# Patient Record
Sex: Female | Born: 1970 | Race: Black or African American | Hispanic: No | Marital: Married | Smoking: Never smoker
Health system: Southern US, Community
[De-identification: ages and names within clinical notes are randomized; demographics above are authoritative.]

## PROBLEM LIST (undated history)

## (undated) DIAGNOSIS — R51 Headache: Secondary | ICD-10-CM

## (undated) DIAGNOSIS — D219 Benign neoplasm of connective and other soft tissue, unspecified: Secondary | ICD-10-CM

## (undated) DIAGNOSIS — D649 Anemia, unspecified: Secondary | ICD-10-CM

## (undated) HISTORY — PX: WISDOM TOOTH EXTRACTION: SHX21

## (undated) HISTORY — PX: NO PAST SURGERIES: SHX2092

---

## 1998-01-10 ENCOUNTER — Encounter (HOSPITAL_COMMUNITY): Admission: RE | Admit: 1998-01-10 | Discharge: 1998-04-10 | Payer: Self-pay | Admitting: Obstetrics and Gynecology

## 1998-06-21 ENCOUNTER — Encounter (HOSPITAL_COMMUNITY): Admission: RE | Admit: 1998-06-21 | Discharge: 1998-09-19 | Payer: Self-pay | Admitting: Obstetrics and Gynecology

## 1998-09-29 ENCOUNTER — Encounter (HOSPITAL_COMMUNITY): Admission: RE | Admit: 1998-09-29 | Discharge: 1998-12-28 | Payer: Self-pay | Admitting: *Deleted

## 1999-01-22 ENCOUNTER — Encounter (HOSPITAL_COMMUNITY): Admission: RE | Admit: 1999-01-22 | Discharge: 1999-04-22 | Payer: Self-pay | Admitting: *Deleted

## 1999-04-24 ENCOUNTER — Encounter (HOSPITAL_COMMUNITY): Admission: RE | Admit: 1999-04-24 | Discharge: 1999-07-23 | Payer: Self-pay | Admitting: *Deleted

## 2001-12-17 ENCOUNTER — Other Ambulatory Visit: Admission: RE | Admit: 2001-12-17 | Discharge: 2001-12-17 | Payer: Self-pay | Admitting: Obstetrics and Gynecology

## 2002-07-17 ENCOUNTER — Inpatient Hospital Stay (HOSPITAL_COMMUNITY): Admission: AD | Admit: 2002-07-17 | Discharge: 2002-07-20 | Payer: Self-pay | Admitting: Obstetrics and Gynecology

## 2005-07-03 ENCOUNTER — Ambulatory Visit (HOSPITAL_COMMUNITY): Admission: RE | Admit: 2005-07-03 | Discharge: 2005-07-03 | Payer: Self-pay | Admitting: Sports Medicine

## 2010-08-23 ENCOUNTER — Emergency Department (HOSPITAL_COMMUNITY): Admission: EM | Admit: 2010-08-23 | Discharge: 2010-08-23 | Payer: Self-pay | Admitting: Emergency Medicine
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
## 2011-09-04 ENCOUNTER — Other Ambulatory Visit: Payer: Self-pay | Admitting: Obstetrics and Gynecology

## 2011-09-04 DIAGNOSIS — Z1231 Encounter for screening mammogram for malignant neoplasm of breast: Secondary | ICD-10-CM

## 2011-09-20 ENCOUNTER — Ambulatory Visit
Admission: RE | Admit: 2011-09-20 | Discharge: 2011-09-20 | Disposition: A | Discharge status: Home / Self Care | Payer: Self-pay | Source: Ambulatory Visit | Attending: Obstetrics and Gynecology | Admitting: Obstetrics and Gynecology

## 2011-09-20 DIAGNOSIS — Z1231 Encounter for screening mammogram for malignant neoplasm of breast: Secondary | ICD-10-CM

## 2012-04-15 ENCOUNTER — Encounter: Payer: Self-pay | Admitting: Obstetrics and Gynecology

## 2012-04-16 ENCOUNTER — Encounter: Payer: Self-pay | Admitting: Obstetrics and Gynecology

## 2012-07-09 ENCOUNTER — Ambulatory Visit (INDEPENDENT_AMBULATORY_CARE_PROVIDER_SITE_OTHER): Payer: Self-pay | Admitting: Obstetrics and Gynecology

## 2012-07-09 VITALS — BP 100/60 | Wt 141.0 lb

## 2012-07-09 DIAGNOSIS — N926 Irregular menstruation, unspecified: Secondary | ICD-10-CM

## 2012-07-09 DIAGNOSIS — N920 Excessive and frequent menstruation with regular cycle: Secondary | ICD-10-CM

## 2012-07-10 LAB — CBC
Hemoglobin: 10.9 g/dL — ABNORMAL LOW (ref 12.0–15.0)
MCH: 28.1 pg (ref 26.0–34.0)
MCHC: 33.4 g/dL (ref 30.0–36.0)
RBC: 3.88 MIL/uL (ref 3.87–5.11)

## 2012-07-10 LAB — TSH: TSH: 1.174 u[IU]/mL (ref 0.350–4.500)

## 2012-07-14 NOTE — Progress Notes (Signed)
C/o heavy cycles w/ clotting Pt already taking OCPs   U/s SHG/EBx at [facility]. Will discuss tx at that time.

## 2012-08-19 ENCOUNTER — Telehealth: Payer: Self-pay | Admitting: Obstetrics and Gynecology

## 2012-08-19 NOTE — Telephone Encounter (Signed)
nd pt 

## 2012-08-20 NOTE — Telephone Encounter (Signed)
Lm on vm tcb rgd msg 

## 2012-08-20 NOTE — Telephone Encounter (Signed)
Spoke with pt rgd msg pt wants test results informed tsh cbc wnl pt also wants to schd sonohystergram pt transferred to [facility] to schd

## 2012-09-10 ENCOUNTER — Encounter: Payer: Self-pay | Admitting: Obstetrics and Gynecology

## 2012-09-11 ENCOUNTER — Ambulatory Visit (INDEPENDENT_AMBULATORY_CARE_PROVIDER_SITE_OTHER): Payer: Self-pay | Admitting: Obstetrics and Gynecology

## 2012-09-11 ENCOUNTER — Encounter: Payer: Self-pay | Admitting: Obstetrics and Gynecology

## 2012-09-11 VITALS — BP 120/70 | Wt 140.0 lb

## 2012-09-11 DIAGNOSIS — N920 Excessive and frequent menstruation with regular cycle: Secondary | ICD-10-CM

## 2012-09-11 NOTE — Progress Notes (Signed)
Pt currently taking microgestin, wants to discuss other birth control options.  Pt still with  heavy bleeding.  She is slightly anemic.  TSH WNL All BC reviewed with the pt She desires Mirena.  Will place if [facility] is normal Pt given lo/lo estrin until IUD placed.  Pt given a sample

## 2012-09-25 ENCOUNTER — Other Ambulatory Visit: Payer: Self-pay

## 2012-09-25 DIAGNOSIS — N926 Irregular menstruation, unspecified: Secondary | ICD-10-CM

## 2012-09-25 DIAGNOSIS — N920 Excessive and frequent menstruation with regular cycle: Secondary | ICD-10-CM

## 2012-09-26 ENCOUNTER — Other Ambulatory Visit: Payer: Self-pay | Admitting: Obstetrics and Gynecology

## 2012-09-26 ENCOUNTER — Ambulatory Visit (INDEPENDENT_AMBULATORY_CARE_PROVIDER_SITE_OTHER): Payer: Self-pay

## 2012-09-26 ENCOUNTER — Encounter: Payer: Self-pay | Admitting: Obstetrics and Gynecology

## 2012-09-26 ENCOUNTER — Ambulatory Visit: Payer: Self-pay | Admitting: Obstetrics and Gynecology

## 2012-09-26 VITALS — BP 140/90 | Wt 141.0 lb

## 2012-09-26 DIAGNOSIS — D219 Benign neoplasm of connective and other soft tissue, unspecified: Secondary | ICD-10-CM

## 2012-09-26 DIAGNOSIS — N926 Irregular menstruation, unspecified: Secondary | ICD-10-CM

## 2012-09-26 MED ORDER — NORETHINDRONE ACET-ETHINYL EST 1-20 MG-MCG PO TABS: 1.0000 | ORAL_TABLET | Freq: Every day | ORAL | Status: DC

## 2012-09-26 NOTE — Patient Instructions (Signed)
Uterine Fibroid  A uterine fibroid is a growth (tumor) that occurs in a woman's uterus. This type of tumor is not cancerous and does not spread out of the uterus. A woman can have one or many fibroids, and the fiboid(s) can become quite large. A fibroid can vary in size, weight, and where it grows in the uterus. Most fibroids do not require medical treatment, but some can cause pain or heavy bleeding during and between periods.  CAUSES   A fibroid is the result of a single uterine cell that keeps growing (unregulated), which is different than most cells in the human body. Most cells have a control mechanism that keeps them from reproducing without control.   SYMPTOMS    Bleeding.   Pelvic pain and pressure.   Bladder problems due to the size of the fibroid.   Infertility and miscarriages depending on the size and location of the fibroid.  DIAGNOSIS   A diagnosis is made by physical exam. Your caregiver may feel the lumpy tumors during a pelvic exam. Important information regarding size, location, and number of tumors can be gained by having an ultrasound. It is rare that other tests, such as a CT scan or MRI, are needed.  TREATMENT    Your caregiver may recommend watchful waiting. This involves getting the fibroid checked by your caregiver to see if the fibroids grow or shrink.    Hormonal treatment or an intrauterine device (IUD) may be prescribed.    Surgery may be needed to remove the fibroids (myomectomy) or the uterus (hysterectomy). This depends on your situation.  When fibroids interfere with fertility and a woman wants to become pregnant, a caregiver may recommend having the fibroids removed.   HOME CARE INSTRUCTIONS   Home care depends on how you were treated. In general:    Keep all follow-up appointments with your caregiver.    Only take medicine as told by your caregiver. Do not take aspirin. It can cause bleeding.    If you have excessive periods and soak tampons or pads in a half hour or  less, contact your caregiver immediately. If your periods are troublesome but not so heavy, lie down with your feet raised slightly above your heart. Place cold packs on your lower abdomen.    If your periods are heavy, write down the number of pads or tampons you use per month. Bring this information to your caregiver.    Talk to your caregiver about taking iron pills.    Include green vegetables in your diet.    If you were prescribed a hormonal treatment, take the hormonal medicines as directed.    If you need surgery, ask your caregiver for information on your specific surgery.   SEEK IMMEDIATE MEDICAL CARE IF:   You have pelvic pain or cramps not controlled with medicines.    You have a sudden increase in pelvic pain.    You have an increase of bleeding between and during periods.    You feel lightheaded or have fainting episodes.   MAKE SURE YOU:   Understand these instructions.   Will watch your condition.   Will get help right away if you are not doing well or get worse.  Document Released: 11/23/2000 Document Revised: 02/18/2012 Document Reviewed: 12/17/2011  Exit[employer] Patient Information 2013 [employer].

## 2012-09-26 NOTE — Progress Notes (Signed)
SHG/EMBX:  The patient was consented for both procedures.  She was placed in dorsal lithotomy position and speculum placed in the vagina.  The cervix was cleansed with three betadine swabs.  The endometrial pipet was placed in the the endometrial cavity through the cervix.  The uterus did sound to 8cm.  The pipet was removed and specimen was sent to pathology.  The sonohysterography catheter was then placed through the cervix and vaginal probe placed back in the vagina. Pt had four fibroids one with a submucosal componentt.  The uterus measures 10.23cm by 8.54 cm.  lnormal ovaries B All treatments given to pt for fibroids she will call with decision

## 2012-10-01 ENCOUNTER — Telehealth: Payer: Self-pay

## 2012-10-01 NOTE — Telephone Encounter (Signed)
Spoke with pt rgd labs informed endo biopsy wnl pt voice understanding 

## 2012-10-01 NOTE — Telephone Encounter (Signed)
Message copied by Rolla Plate on Wed Oct 01, 2012  8:49 AM ------      Message from: Jaymes Graff      Created: Tue Sep 30, 2012  9:56 PM       Please call the patient and let her know her endometrial biopsy is normal

## 2012-10-29 ENCOUNTER — Other Ambulatory Visit: Payer: Self-pay | Admitting: Obstetrics and Gynecology

## 2012-10-29 DIAGNOSIS — Z1231 Encounter for screening mammogram for malignant neoplasm of breast: Secondary | ICD-10-CM

## 2012-11-10 ENCOUNTER — Encounter: Payer: Self-pay | Admitting: Obstetrics and Gynecology

## 2012-11-10 ENCOUNTER — Ambulatory Visit (INDEPENDENT_AMBULATORY_CARE_PROVIDER_SITE_OTHER): Payer: Self-pay | Admitting: Obstetrics and Gynecology

## 2012-11-10 VITALS — BP 124/80 | Wt 145.0 lb

## 2012-11-10 DIAGNOSIS — N920 Excessive and frequent menstruation with regular cycle: Secondary | ICD-10-CM

## 2012-11-10 MED ORDER — CYCLOBENZAPRINE HCL 5 MG PO TABS: 5.0000 mg | ORAL_TABLET | Freq: Three times a day (TID) | ORAL | Status: DC | PRN

## 2012-11-10 NOTE — Patient Instructions (Signed)

## 2012-11-10 NOTE — Progress Notes (Signed)
F/u fibroids  All txs for fibroids and menorrhagia reviewed with the pt and her husband.  ( obs, hormones, lupron, [place], endometrial ablation, myomectomy and hysterectomy.   The pt desires to have an endometrial ablation.  Risks and benefits reviewed with the pt.  20 min spent with the pt.will scheudle the procedure.  She also desires flexeril for back spasms during her menses

## 2012-11-17 ENCOUNTER — Telehealth: Payer: Self-pay | Admitting: Obstetrics and Gynecology

## 2012-12-04 ENCOUNTER — Ambulatory Visit: Payer: Self-pay

## 2013-01-05 ENCOUNTER — Ambulatory Visit
Admission: RE | Admit: 2013-01-05 | Discharge: 2013-01-05 | Disposition: A | Discharge status: Home / Self Care | Payer: BC Managed Care – PPO | Source: Ambulatory Visit | Attending: Obstetrics and Gynecology | Admitting: Obstetrics and Gynecology

## 2013-01-05 DIAGNOSIS — Z1231 Encounter for screening mammogram for malignant neoplasm of breast: Secondary | ICD-10-CM

## 2013-02-06 ENCOUNTER — Other Ambulatory Visit: Payer: Self-pay

## 2013-02-06 MED ORDER — DIAZEPAM 10 MG PO TABS: ORAL_TABLET | ORAL | Status: DC

## 2013-02-06 MED ORDER — IBUPROFEN 800 MG PO TABS: 800.0000 mg | ORAL_TABLET | Freq: Three times a day (TID) | ORAL | Status: DC | PRN

## 2013-02-06 MED ORDER — PROMETHAZINE HCL 25 MG PO TABS: ORAL_TABLET | ORAL | Status: DC

## 2013-02-06 MED ORDER — HYDROCODONE-ACETAMINOPHEN 5-300 MG PO TABS: ORAL_TABLET | ORAL | Status: DC

## 2013-02-16 ENCOUNTER — Telehealth: Payer: Self-pay | Admitting: Obstetrics and Gynecology

## 2013-02-17 ENCOUNTER — Ambulatory Visit: Payer: BC Managed Care – PPO | Admitting: Obstetrics and Gynecology

## 2013-02-17 ENCOUNTER — Encounter: Payer: Self-pay | Admitting: Obstetrics and Gynecology

## 2013-02-17 VITALS — BP 116/70 | Ht 64.0 in | Wt 141.0 lb

## 2013-02-17 DIAGNOSIS — N920 Excessive and frequent menstruation with regular cycle: Secondary | ICD-10-CM

## 2013-02-17 MED ORDER — NORETHINDRONE-ETH ESTRADIOL 1-50 MG-MCG PO TABS: 1.0000 | ORAL_TABLET | Freq: Every day | ORAL | Status: DC

## 2013-02-17 MED ORDER — FERROUS SULFATE 325 (65 FE) MG PO TBEC: 325.0000 mg | DELAYED_RELEASE_TABLET | Freq: Two times a day (BID) | ORAL | Status: DC

## 2013-02-17 MED ORDER — IBUPROFEN 600 MG PO TABS: 600.0000 mg | ORAL_TABLET | Freq: Four times a day (QID) | ORAL | Status: DC | PRN

## 2013-02-17 NOTE — Patient Instructions (Signed)

## 2013-02-17 NOTE — Progress Notes (Signed)
When did bleeding start: 02/01/13 How  Long: 2 weeks How often changing pad/tampon: the first week heavy every hr second week every 2 hrs pasing clots Bleeding Disorders: no Cramping: no Contraception: yes Fibroids: yes Hormone Therapy: no New Medications: no Menopausal Symptoms: no Vag. Discharge: no Abdominal Pain: yes Increased Stress: no  Pt also c/o headaches for 2 weeks same time bleeding started.  Pt with SOB with movement.  Motrin helps a little with the headaches.  Pt missed one month of OCPs bc it was on back oreder.  She just started the pills again 2/23/ 14 and is still bleeding.  She has not missed any pillls.  She denies chest pain or visual changes.   BP 116/70  Ht 5\' 4"  (1.626 m)  Wt 141 lb (63.957 kg)  BMI 24.19 kg/m2  LMP 02/01/2013 Physical Examination: General appearance - alert, well appearing, and in no distress Mental status - alert, oriented to person, place, and time Chest - clear to auscultation, no wheezes, rales or rhonchi, symmetric air entry Heart - normal rate and regular rhythm Abdomen - soft, nontender, nondistended, no masses or organomegaly Pelvic - VULVA: normal appearing vulva with no masses, tenderness or lesions, VAGINA: normal appearing vagina with normal color and discharge, no lesions, CERVIX: normal appearing cervix without discharge or lesions, moderate VB, UTERUS: enlarged to 12 week's size, irregular, ADNEXA: normal adnexa in size, nontender and no masses Extremities - peripheral pulses normal, no pedal edema, no clubbing or cyanosis Results for orders placed in visit on 02/17/13  POCT URINE PREGNANCY      Result Value Range   Preg Test, Ur Negative     Menorrhagia pt scheduled for  Ablation in April.  Offered it earlier.  Pt offered in pt tx vs out pt tx.  She will do pill taper with OCp and iron. With tylenol and motrin for headaches.  Check a CBC today.  Risk of DVT explained in detail to the pt

## 2013-02-18 ENCOUNTER — Encounter: Payer: Self-pay | Admitting: Obstetrics and Gynecology

## 2013-02-18 ENCOUNTER — Telehealth: Payer: Self-pay | Admitting: Obstetrics and Gynecology

## 2013-02-18 LAB — CBC WITH DIFFERENTIAL/PLATELET
Basophils Absolute: 0 10*3/uL (ref 0.0–0.1)
Basophils Relative: 1 % (ref 0–1)
Eosinophils Absolute: 0.1 10*3/uL (ref 0.0–0.7)
HCT: 16.1 % — ABNORMAL LOW (ref 36.0–46.0)
MCV: 78.5 fL (ref 78.0–100.0)
Monocytes Absolute: 0.4 10*3/uL (ref 0.1–1.0)
Neutro Abs: 4.4 10*3/uL (ref 1.7–7.7)
Neutrophils Relative %: 73 % (ref 43–77)
Platelets: 296 10*3/uL (ref 150–400)

## 2013-02-18 NOTE — Progress Notes (Unsigned)
Patient ID: Brand Males, female   DOB: Mar 10, 1971, 42 y.o.   MRN: [id number] Phone call to patient .  She was told she has a hemoglobin of 5.1.  She wants to come in for blood transfusion and IV premarin.  She still has some mild bleeding and does not want to be admitted.  She is planning to have an ablation

## 2013-02-18 NOTE — Telephone Encounter (Signed)
TC to pt. States has already spoken with someone and issue has been resolved.

## 2013-02-18 NOTE — Telephone Encounter (Signed)
VM from pt. Saw Dr ND 02/17/13. RX not covered by insurance. Is there a generic?  Pt [phone]

## 2013-02-19 ENCOUNTER — Encounter (HOSPITAL_COMMUNITY): Payer: Self-pay | Admitting: General Practice

## 2013-02-19 ENCOUNTER — Telehealth: Payer: Self-pay

## 2013-02-19 ENCOUNTER — Observation Stay (HOSPITAL_COMMUNITY)
Admission: AD | Admit: 2013-02-19 | Discharge: 2013-02-19 | Disposition: A | Discharge status: Home / Self Care | Payer: BC Managed Care – PPO | Source: Ambulatory Visit | Attending: Obstetrics and Gynecology | Admitting: Obstetrics and Gynecology

## 2013-02-19 DIAGNOSIS — D62 Acute posthemorrhagic anemia: Secondary | ICD-10-CM | POA: Diagnosis present

## 2013-02-19 DIAGNOSIS — D259 Leiomyoma of uterus, unspecified: Secondary | ICD-10-CM | POA: Insufficient documentation

## 2013-02-19 DIAGNOSIS — N920 Excessive and frequent menstruation with regular cycle: Secondary | ICD-10-CM | POA: Insufficient documentation

## 2013-02-19 DIAGNOSIS — D649 Anemia, unspecified: Principal | ICD-10-CM | POA: Insufficient documentation

## 2013-02-19 HISTORY — DX: Headache: R51

## 2013-02-19 HISTORY — DX: Benign neoplasm of connective and other soft tissue, unspecified: D21.9

## 2013-02-19 HISTORY — DX: Anemia, unspecified: D64.9

## 2013-02-19 MED ORDER — DIPHENHYDRAMINE HCL 25 MG PO CAPS
25.0000 mg | ORAL_CAPSULE | Freq: Once | ORAL | Status: AC
  Administered 2013-02-19: 25 mg via ORAL
  Filled 2013-02-19: qty 1

## 2013-02-19 MED ORDER — ESTROGENS CONJUGATED 25 MG IJ SOLR
25.0000 mg | Freq: Four times a day (QID) | INTRAMUSCULAR | Status: DC | PRN
  Administered 2013-02-19: 25 mg via INTRAVENOUS
  Filled 2013-02-19 (×2): qty 25

## 2013-02-19 MED ORDER — SODIUM CHLORIDE 0.9 % IV SOLN
500.0000 mL | Freq: Once | INTRAVENOUS | Status: AC
  Administered 2013-02-19: 500 mL via INTRAVENOUS

## 2013-02-19 MED ORDER — ACETAMINOPHEN 325 MG PO TABS
650.0000 mg | ORAL_TABLET | Freq: Once | ORAL | Status: AC
  Administered 2013-02-19: 650 mg via ORAL
  Filled 2013-02-19: qty 2

## 2013-02-19 NOTE — Progress Notes (Signed)
Patient ID: Julia Hogan, female   DOB: [birth year], 42 y.o.   MRN: [id number] Julia Hogan is an 42 y.o. female. She presents for blood transfusion and IVpremarin to tx symptomatic anemia.  Pt presented to my office with a headache, SOB  And heavy bleeding with her last menses.  She was unable to take her birth control for one month because she went out of town and did not pick u her perscription  Pertinent Gynecological History: Menses: flow is excessive with use of 12 pads or tampons on heaviest days Bleeding: dysfunctional uterine bleeding Contraception: OCP (estrogen/progesterone) DES exposure: denies Blood transfusions: she is recieving three today Sexually transmitted diseases: no past history Previous GYN Procedures: see history  Last mammogram: normal Date: 10/ Last pap: normal Date: 2013 OB History: G2, P2   Menstrual History: Menarche age: 42  Patient's last menstrual period was 02/01/2013.    Past Medical History  Diagnosis Date  . Anemia   . Headache   . Fibroid     Past Surgical History  Procedure Laterality Date  . Wisdom tooth extraction    . No past surgeries      Family History  Problem Relation Age of Onset  . Hypertension Mother   . Hypertension Father   . Cancer Father     esophageal  . Heart attack Father   . Migraines Brother   . Sickle cell trait Cousin     Social History:  reports that she has never smoked. She does not have any smokeless tobacco history on file. She reports that she does not drink alcohol or use illicit drugs.  Allergies:  Allergies  Allergen Reactions  . Penicillins Hives    Prescriptions prior to admission  Medication Sig Dispense Refill  . acetaminophen (TYLENOL) 500 MG tablet Take 500 mg by mouth every 6 (six) hours as needed for pain.      . ferrous sulfate 325 (65 FE) MG EC tablet Take 1 tablet (325 mg total) by mouth 2 (two) times daily with a meal.  90 tablet  11  . ibuprofen (ADVIL,MOTRIN) 600 MG tablet  Take 1 tablet (600 mg total) by mouth every 6 (six) hours as needed for pain.  30 tablet  0  . norethindrone-ethinyl estradiol (MICROGESTIN,JUNEL,LOESTRIN) 1-20 MG-MCG tablet Take 1 tablet by mouth daily.  1 Package  11  . cyclobenzaprine (FLEXERIL) 5 MG tablet Take 1 tablet (5 mg total) by mouth 3 (three) times daily as needed for muscle spasms.  30 tablet  0  . diazepam (VALIUM) 10 MG tablet Use as directed  1 tablet  0    ROS  Blood pressure 127/81, pulse 80, temperature 97.9 F (36.6 C), temperature source Oral, resp. rate 18, height 5\' 4"  (1.626 m), weight 141 lb (63.957 kg), last menstrual period 02/01/2013, SpO2 100.00%. Physical Exam  Results for orders placed during the hospital encounter of 02/19/13 (from the past 24 hour(s))  PREPARE RBC (CROSSMATCH)     Status: None   Collection Time    02/19/13  8:00 AM      Result Value Range   Order Confirmation ORDER PROCESSED BY BLOOD BANK    ABO/RH     Status: None   Collection Time    02/19/13  8:13 AM      Result Value Range   ABO/RH(D) O POS    TYPE AND SCREEN     Status: None   Collection Time    02/19/13  8:18 AM  Result Value Range   ABO/RH(D) O POS     Antibody Screen NEG     Sample Expiration 02/22/2013     Unit Number [id number]     Blood Component Type RED CELLS,LR     Unit division 00     Status of Unit ALLOCATED     Transfusion Status OK TO TRANSFUSE     Crossmatch Result Compatible     Unit Number [id number]     Blood Component Type RED CELLS,LR     Unit division 00     Status of Unit ISSUED     Transfusion Status OK TO TRANSFUSE     Crossmatch Result Compatible     Unit Number [id number]     Blood Component Type RED CELLS,LR     Unit division 00     Status of Unit ISSUED     Transfusion Status OK TO TRANSFUSE     Crossmatch Result Compatible      No results found. Physical Examination: General appearance - alert, well appearing, and in no distress Chest - clear to auscultation, no  wheezes, rales or rhonchi, symmetric air entry Heart - normal rate and regular rhythm Abdomen - soft, nontender, nondistended, no masses or organomegaly Pelvic - normal external genitalia, vulva, vagina, cervix, uterus and adnexa, pt with spotting Extremities - peripheral pulses normal, no pedal edema, no clubbing or cyanosis   Assessment/Plan: Symptomatic anemia Fibroids Menorrhagia Will give blood Continue iron Give iv premarin to stop bleeding completely Then restart OCPs Endometrial ablation is planned for April   DILLARD,NAIMA A 02/19/2013, 1:26 PM

## 2013-02-19 NOTE — Progress Notes (Signed)
Pt discharged home with mother... Condition stable... No equipment... Ambulated to car with E. Caldwell, RN.  

## 2013-02-19 NOTE — Telephone Encounter (Signed)
Spoke with pt rgd rx informed nes rx called to pharm for ovcon 35 take one pil four times a day for two days, then one pill tid for two days,then one pill bid for two days skip placebo and do one pill a day per ND pt voice understanding

## 2013-02-20 LAB — TYPE AND SCREEN
Antibody Screen: NEGATIVE
Unit division: 0
Unit division: 0

## 2013-02-20 NOTE — Progress Notes (Signed)
Pt is discharged in the care of Mother. Stable. States she feels better after blood transfusuins. Only a small amount of vaginal bleeding noted on VPad. Also pt was given estrogen as ordered by Dr. Denies any pain or discomfort. Alldischarged instruction were given to pt. Understood all instructions well.

## 2013-03-02 ENCOUNTER — Telehealth: Payer: Self-pay | Admitting: Obstetrics and Gynecology

## 2013-03-02 NOTE — Telephone Encounter (Signed)
D&C Hysteroscopy Thermachoice Endometrial ablation scheduled for 03/19/13 @ 10:45 with ND. [employer] effective1/1/14. Plan pays 80/20 after a $5,000 deductible. Pre-op due $3,174.51. -ap

## 2013-04-17 ENCOUNTER — Other Ambulatory Visit: Payer: Self-pay | Admitting: Obstetrics and Gynecology

## 2013-05-26 ENCOUNTER — Other Ambulatory Visit (HOSPITAL_COMMUNITY): Payer: Self-pay | Admitting: Obstetrics and Gynecology

## 2013-05-26 NOTE — H&P (Signed)
Julia Hogan is a 42 y.o. female G3; P 2-0-1-2 who presents for hysterectomy because of symptomatic uterine fibroids, menorrhagia and a history of severe anemia. For years the patient has been able to manage her chronic menorrhagia with oral contraceptives until this past year her flow became unbearable. During the course of her 8 day menses, she would change a pad every 30 minutes and experience cramping that she rated as a 7/10 on a 10 point pain scale. Fortunately her cramps were relieved with Ibuprofen 600 mg alternating with Tylenol Extra Strength.  She admits to intermenstrual bleeding characterized by spotting with episodes of clots, cramps and pelvic pressure.  She goes on to report lower back spasms and constipation but has managed both with muscle relaxants and  Miralax respectively.  She denies any urinary tract symptoms, dyspareunia, urgency, frequency or dyspareunia.  In March of this year, the patient bled so heavily that her Hgb was 5.1 and was subsequently  placed on IV Premarin and transfused 3 units of packed red blood cells. There was an attempt to perform Thermachoice endometrial ablation however, it had to be aborted due to the endometrial cavity being too large.  A TSH at that time was normal.  A pelvic ultrasound from October 2013 showed a retroverted uterus: 10.23 x 8.54 x 6.51 cm  [SHG showed no endometrial masses], #4 measurable fibroids: fundal-5.72 x 3.86 x 4.42 cm, posterior-3.72 x 3.72 x 2.95 cm, right serosal-2.94 x 2.45 x 3.09 cm and intramural-2.59 x 1.86 x 2.38cm.  Right ovary-3.19 x 1.29 x 1.29 cm and left ovary- 3.86 x 1.98 x 1.41 cm..  Endometrial biopsy done at that visit was benign.  Since her major bleeding episode in March, the patient has been on Ovcon 35 continuously with continued but less bleeding.  A review of both medical and surgical management options were given to the patient along with the risks and benefits of each however, the patient has decided on definitive  therapy in the form of hysterectomy.   Past Medical History  OB History: G3; P 2-0-1-2 SVD:  1998 and 2003 (largest infant 8+ pounds)  GYN History: menarche: 42 YO;    LMP: see HPI    Contracepton oral contraceptives (estrogen/progesterone)  The patient denies history of sexually transmitted disease.  Denies history of abnormal PAP smear;  Last PAP smear:  2013  Medical History: Severe anemia  Surgical History:  2014 Attempted Thermachoice Endometrial Ablation-unable to complete due to size of uterine cavity Denies problems with anesthesia. Was transfused 3 units of packed red blood cells in April 2014  Family History: Hypertension, thyroid disease, esophageal cancer,migraines and cardiovascular disease  Social History:  Married, employed as an Government social research officer; Denies alcohol, tobacco or illicit drug use  Medicine:  Ovcon 35 daily Iron Supplementation daily  Allergies  Allergen Reactions  . Penicillins Hives    Denies sensitivity to peanuts, shellfish, soy, latex or adhesives.   ROS: Admits to glasses to drive but, enies headache, vision changes, nasal congestion, dysphagia, tinnitus, dizziness, hoarseness, cough,  chest pain, shortness of breath, nausea, vomiting, diarrhea,constipation,  urinary frequency, urgency  dysuria, hematuria, vaginitis symptoms, pelvic pain, swelling of joints,easy bruising,  myalgias, arthralgias, skin rashes, unexplained weight loss and except as is mentioned in the history of present illness, patient's review of systems is otherwise negative.   Physical Exam  Bp 120/80  P 72  R 14  T 98.4 degrees F orally  Weight  142lbs  Height  5'4"  BMI  24.4  Neck: supple without masses or thyromegaly Lungs: clear to auscultation Heart: regular rate and rhythm Abdomen: soft, non-tender and no organomegaly; firm mass from pelvis to approximately 3 fingers above symphysis pubis Pelvic:EGBUS- wnl; vagina-normal rugae with moderate blood; uterus-14-16 week  sized and irregular,  adnexae-no tenderness or masses Extremities:  no clubbing, cyanosis or edema   Assesment:  Symptomatic Uterine Fibroids                       Menorrhagia                       History of Severe Anemia   Disposition:  A discussion was held with patient regarding the indication for her procedure(s) along with the risks, which include but are not limited to: reaction to anesthesia, damage to adjacent organs, infection and excessive bleeding. The patient was given a Miralax Bowel Prep to be completed the day before her surgery.  She verbalized understanding of these risks and pre-operative instructions and has consented to proceed with a Total Laparoscopic Hysterectomy with a possible Laparoscopically Assisted Vaginal Hysterectomy and Cystoscopy at [facility] [place] on June 08, 2013 at 9 a.m.   CSN# [id number]   Laquilla Dault J. Lowell Guitar, PA-C  for Dr. Pierre Bali.Dillard

## 2013-05-27 ENCOUNTER — Encounter (HOSPITAL_COMMUNITY): Payer: Self-pay | Admitting: Pharmacist

## 2013-06-03 ENCOUNTER — Encounter (HOSPITAL_COMMUNITY): Payer: Self-pay

## 2013-06-03 ENCOUNTER — Encounter (HOSPITAL_COMMUNITY)
Admission: RE | Admit: 2013-06-03 | Discharge: 2013-06-03 | Disposition: A | Discharge status: Home / Self Care | Payer: BC Managed Care – PPO | Source: Ambulatory Visit | Attending: Obstetrics and Gynecology | Admitting: Obstetrics and Gynecology

## 2013-06-03 LAB — CBC
Hemoglobin: 13 g/dL (ref 12.0–15.0)
MCH: 28.3 pg (ref 26.0–34.0)
MCV: 83.2 fL (ref 78.0–100.0)
Platelets: 204 10*3/uL (ref 150–400)
RBC: 4.59 MIL/uL (ref 3.87–5.11)
WBC: 9.4 10*3/uL (ref 4.0–10.5)

## 2013-06-03 NOTE — Patient Instructions (Addendum)
20 Julia Hogan [facility]  06/03/2013   Your procedure is scheduled on:  06/08/13  Enter through the Main Entrance of [facility] at 830 AM.  Pick up the phone at the desk and dial [phone].   Call this number if you have problems the morning of surgery: [phone]   Remember:   Do not eat food:After Midnight.  Do not drink clear liquids: After Midnight.  Take these medicines the morning of surgery with A SIP OF WATER: NA   Do not wear jewelry, make-up or nail polish.  Do not wear lotions, powders, or perfumes. You may wear deodorant.  Do not shave 48 hours prior to surgery.  Do not bring valuables to the hospital.  [facility] is not responsible                  for any belongings or valuables brought to the hospital.  Contacts, dentures or bridgework may not be worn into surgery.  Leave suitcase in the car. After surgery it may be brought to your room.  For patients admitted to the hospital, checkout time is 11:00 AM the day of                discharge.   Patients discharged the day of surgery will not be allowed to drive                   home.  Name and phone number of your driver: NA  Special Instructions: Shower using CHG 2 nights before surgery and the night before surgery.  If you shower the day of surgery use CHG.  Use special wash - you have one bottle of CHG for all showers.  You should use approximately 1/3 of the bottle for each shower.   Please read over the following fact sheets that you were given: MRSA Information

## 2013-06-07 MED ORDER — GENTAMICIN SULFATE 40 MG/ML IJ SOLN
INTRAVENOUS | Status: AC
  Administered 2013-06-08: 100 mL via INTRAVENOUS
  Filled 2013-06-07: qty 8

## 2013-06-08 ENCOUNTER — Encounter (HOSPITAL_COMMUNITY): Payer: Self-pay | Admitting: Anesthesiology

## 2013-06-08 ENCOUNTER — Encounter (HOSPITAL_COMMUNITY)
Admission: RE | Disposition: A | Discharge status: Home / Self Care | Payer: Self-pay | Source: Ambulatory Visit | Attending: Obstetrics and Gynecology

## 2013-06-08 ENCOUNTER — Ambulatory Visit (HOSPITAL_COMMUNITY): Payer: BC Managed Care – PPO | Admitting: Anesthesiology

## 2013-06-08 ENCOUNTER — Observation Stay (HOSPITAL_COMMUNITY)
Admission: RE | Admit: 2013-06-08 | Discharge: 2013-06-09 | Disposition: A | Discharge status: Home / Self Care | Payer: BC Managed Care – PPO | Source: Ambulatory Visit | Attending: Obstetrics and Gynecology | Admitting: Obstetrics and Gynecology

## 2013-06-08 DIAGNOSIS — D500 Iron deficiency anemia secondary to blood loss (chronic): Secondary | ICD-10-CM | POA: Insufficient documentation

## 2013-06-08 DIAGNOSIS — D252 Subserosal leiomyoma of uterus: Secondary | ICD-10-CM | POA: Insufficient documentation

## 2013-06-08 DIAGNOSIS — N838 Other noninflammatory disorders of ovary, fallopian tube and broad ligament: Secondary | ICD-10-CM | POA: Insufficient documentation

## 2013-06-08 DIAGNOSIS — N920 Excessive and frequent menstruation with regular cycle: Principal | ICD-10-CM | POA: Insufficient documentation

## 2013-06-08 DIAGNOSIS — Z90710 Acquired absence of both cervix and uterus: Secondary | ICD-10-CM

## 2013-06-08 DIAGNOSIS — N72 Inflammatory disease of cervix uteri: Secondary | ICD-10-CM | POA: Insufficient documentation

## 2013-06-08 DIAGNOSIS — D251 Intramural leiomyoma of uterus: Secondary | ICD-10-CM | POA: Insufficient documentation

## 2013-06-08 DIAGNOSIS — N854 Malposition of uterus: Secondary | ICD-10-CM | POA: Insufficient documentation

## 2013-06-08 HISTORY — PX: LAPAROSCOPIC HYSTERECTOMY: SHX1926

## 2013-06-08 HISTORY — PX: CYSTOSCOPY: SHX5120

## 2013-06-08 HISTORY — PX: BILATERAL SALPINGECTOMY: SHX5743

## 2013-06-08 LAB — PREGNANCY, URINE: Preg Test, Ur: NEGATIVE

## 2013-06-08 SURGERY — HYSTERECTOMY, TOTAL, LAPAROSCOPIC
Anesthesia: General | Site: Bladder | Wound class: Clean Contaminated

## 2013-06-08 MED ORDER — VASOPRESSIN 20 UNIT/ML IJ SOLN
INTRAMUSCULAR | Status: AC
  Filled 2013-06-08: qty 1

## 2013-06-08 MED ORDER — ROCURONIUM BROMIDE 50 MG/5ML IV SOLN
INTRAVENOUS | Status: AC
  Filled 2013-06-08: qty 1

## 2013-06-08 MED ORDER — KETOROLAC TROMETHAMINE 30 MG/ML IJ SOLN
30.0000 mg | Freq: Four times a day (QID) | INTRAMUSCULAR | Status: DC
  Administered 2013-06-09 (×2): 30 mg via INTRAVENOUS
  Filled 2013-06-08 (×2): qty 1

## 2013-06-08 MED ORDER — INDIGOTINDISULFONATE SODIUM 8 MG/ML IJ SOLN
INTRAMUSCULAR | Status: AC
  Filled 2013-06-08: qty 5

## 2013-06-08 MED ORDER — LACTATED RINGERS IR SOLN
Status: DC | PRN
  Administered 2013-06-08: 3000 mL

## 2013-06-08 MED ORDER — DEXAMETHASONE SODIUM PHOSPHATE 10 MG/ML IJ SOLN
INTRAMUSCULAR | Status: DC | PRN
  Administered 2013-06-08: 10 mg via INTRAVENOUS

## 2013-06-08 MED ORDER — FENTANYL CITRATE 0.05 MG/ML IJ SOLN
INTRAMUSCULAR | Status: AC
  Filled 2013-06-08: qty 5

## 2013-06-08 MED ORDER — KETOROLAC TROMETHAMINE 30 MG/ML IJ SOLN
INTRAMUSCULAR | Status: AC
  Filled 2013-06-08: qty 1

## 2013-06-08 MED ORDER — GLYCOPYRROLATE 0.2 MG/ML IJ SOLN
INTRAMUSCULAR | Status: DC | PRN
  Administered 2013-06-08: .5 mg via INTRAVENOUS

## 2013-06-08 MED ORDER — ONDANSETRON HCL 4 MG/2ML IJ SOLN
INTRAMUSCULAR | Status: AC
  Filled 2013-06-08: qty 2

## 2013-06-08 MED ORDER — ROCURONIUM BROMIDE 100 MG/10ML IV SOLN
INTRAVENOUS | Status: DC | PRN
  Administered 2013-06-08: 20 mg via INTRAVENOUS
  Administered 2013-06-08: 50 mg via INTRAVENOUS

## 2013-06-08 MED ORDER — KETOROLAC TROMETHAMINE 30 MG/ML IJ SOLN
INTRAMUSCULAR | Status: DC | PRN
  Administered 2013-06-08: 30 mg via INTRAVENOUS

## 2013-06-08 MED ORDER — MIDAZOLAM HCL 2 MG/2ML IJ SOLN
INTRAMUSCULAR | Status: AC
  Filled 2013-06-08: qty 2

## 2013-06-08 MED ORDER — DEXTROSE IN LACTATED RINGERS 5 % IV SOLN
INTRAVENOUS | Status: DC
  Administered 2013-06-08 – 2013-06-09 (×2): via INTRAVENOUS

## 2013-06-08 MED ORDER — VASOPRESSIN 20 UNIT/ML IJ SOLN
INTRAVENOUS | Status: DC | PRN
  Administered 2013-06-08: 14:00:00 via INTRAMUSCULAR

## 2013-06-08 MED ORDER — LIDOCAINE HCL (CARDIAC) 20 MG/ML IV SOLN
INTRAVENOUS | Status: AC
  Filled 2013-06-08: qty 5

## 2013-06-08 MED ORDER — NEOSTIGMINE METHYLSULFATE 1 MG/ML IJ SOLN
INTRAMUSCULAR | Status: AC
  Filled 2013-06-08: qty 1

## 2013-06-08 MED ORDER — LACTATED RINGERS IV SOLN
INTRAVENOUS | Status: DC
  Administered 2013-06-08 (×4): via INTRAVENOUS

## 2013-06-08 MED ORDER — INDIGOTINDISULFONATE SODIUM 8 MG/ML IJ SOLN
INTRAMUSCULAR | Status: DC | PRN
  Administered 2013-06-08: 5 mL via INTRAVENOUS

## 2013-06-08 MED ORDER — KETOROLAC TROMETHAMINE 30 MG/ML IJ SOLN: 15.0000 mg | Freq: Once | INTRAMUSCULAR | Status: DC | PRN

## 2013-06-08 MED ORDER — HYDROMORPHONE 0.3 MG/ML IV SOLN
INTRAVENOUS | Status: DC
  Administered 2013-06-08: 16:00:00 via INTRAVENOUS
  Administered 2013-06-08 – 2013-06-09 (×3): 0.2 mg via INTRAVENOUS
  Filled 2013-06-08: qty 25

## 2013-06-08 MED ORDER — ACETAMINOPHEN 160 MG/5ML PO SOLN
ORAL | Status: AC
  Administered 2013-06-08: 975 mg via ORAL
  Filled 2013-06-08: qty 40.6

## 2013-06-08 MED ORDER — FERROUS SULFATE 325 (65 FE) MG PO TABS
325.0000 mg | ORAL_TABLET | Freq: Every day | ORAL | Status: DC
  Administered 2013-06-09: 325 mg via ORAL
  Filled 2013-06-08: qty 1

## 2013-06-08 MED ORDER — HYDROMORPHONE HCL PF 1 MG/ML IJ SOLN: 0.2500 mg | INTRAMUSCULAR | Status: DC | PRN

## 2013-06-08 MED ORDER — MIDAZOLAM HCL 5 MG/5ML IJ SOLN
INTRAMUSCULAR | Status: DC | PRN
  Administered 2013-06-08: 2 mg via INTRAVENOUS

## 2013-06-08 MED ORDER — ESTRADIOL 0.1 MG/GM VA CREA
TOPICAL_CREAM | VAGINAL | Status: AC
  Filled 2013-06-08: qty 42.5

## 2013-06-08 MED ORDER — DIPHENHYDRAMINE HCL 12.5 MG/5ML PO ELIX: 12.5000 mg | ORAL_SOLUTION | Freq: Four times a day (QID) | ORAL | Status: DC | PRN

## 2013-06-08 MED ORDER — MENTHOL 3 MG MT LOZG: 1.0000 | LOZENGE | OROMUCOSAL | Status: DC | PRN

## 2013-06-08 MED ORDER — DIPHENHYDRAMINE HCL 50 MG/ML IJ SOLN: 12.5000 mg | Freq: Four times a day (QID) | INTRAMUSCULAR | Status: DC | PRN

## 2013-06-08 MED ORDER — BUPIVACAINE HCL (PF) 0.25 % IJ SOLN
INTRAMUSCULAR | Status: DC | PRN
  Administered 2013-06-08: 10 mL

## 2013-06-08 MED ORDER — DEXAMETHASONE SODIUM PHOSPHATE 10 MG/ML IJ SOLN
INTRAMUSCULAR | Status: AC
  Filled 2013-06-08: qty 1

## 2013-06-08 MED ORDER — IBUPROFEN 600 MG PO TABS
600.0000 mg | ORAL_TABLET | Freq: Four times a day (QID) | ORAL | Status: DC | PRN
  Administered 2013-06-09: 600 mg via ORAL
  Filled 2013-06-08: qty 1

## 2013-06-08 MED ORDER — STERILE WATER FOR IRRIGATION IR SOLN
Status: DC | PRN
  Administered 2013-06-08: 1000 mL

## 2013-06-08 MED ORDER — LIDOCAINE HCL (CARDIAC) 20 MG/ML IV SOLN
INTRAVENOUS | Status: DC | PRN
  Administered 2013-06-08: 60 mg via INTRAVENOUS

## 2013-06-08 MED ORDER — BENZOCAINE 20 % MT SOLN
Freq: Four times a day (QID) | OROMUCOSAL | Status: DC | PRN
Start: 2013-06-08 — End: 2013-06-09
  Administered 2013-06-09: 15:00:00 via OROMUCOSAL
  Filled 2013-06-08: qty 57

## 2013-06-08 MED ORDER — SODIUM CHLORIDE 0.9 % IJ SOLN: 9.0000 mL | INTRAMUSCULAR | Status: DC | PRN

## 2013-06-08 MED ORDER — HEPARIN SODIUM (PORCINE) 5000 UNIT/ML IJ SOLN
INTRAMUSCULAR | Status: DC | PRN
  Administered 2013-06-08: 5000 [IU] via SUBCUTANEOUS

## 2013-06-08 MED ORDER — GLYCOPYRROLATE 0.2 MG/ML IJ SOLN
INTRAMUSCULAR | Status: AC
  Filled 2013-06-08: qty 2

## 2013-06-08 MED ORDER — HYDROMORPHONE HCL PF 1 MG/ML IJ SOLN
INTRAMUSCULAR | Status: AC
  Filled 2013-06-08: qty 1

## 2013-06-08 MED ORDER — ZOLPIDEM TARTRATE 5 MG PO TABS: 5.0000 mg | ORAL_TABLET | Freq: Every evening | ORAL | Status: DC | PRN

## 2013-06-08 MED ORDER — ONDANSETRON HCL 4 MG/2ML IJ SOLN
INTRAMUSCULAR | Status: DC | PRN
  Administered 2013-06-08: 4 mg via INTRAVENOUS

## 2013-06-08 MED ORDER — HYDROMORPHONE HCL PF 1 MG/ML IJ SOLN
INTRAMUSCULAR | Status: DC | PRN
Start: 2013-06-08 — End: 2013-06-08
  Administered 2013-06-08 (×2): 0.5 mg via INTRAVENOUS

## 2013-06-08 MED ORDER — BUPIVACAINE HCL (PF) 0.25 % IJ SOLN
INTRAMUSCULAR | Status: AC
  Filled 2013-06-08: qty 30

## 2013-06-08 MED ORDER — ONDANSETRON HCL 4 MG/2ML IJ SOLN: 4.0000 mg | Freq: Four times a day (QID) | INTRAMUSCULAR | Status: DC | PRN

## 2013-06-08 MED ORDER — PROPOFOL 10 MG/ML IV EMUL
INTRAVENOUS | Status: AC
  Filled 2013-06-08: qty 20

## 2013-06-08 MED ORDER — FENTANYL CITRATE 0.05 MG/ML IJ SOLN
INTRAMUSCULAR | Status: DC | PRN
  Administered 2013-06-08: 50 ug via INTRAVENOUS
  Administered 2013-06-08: 150 ug via INTRAVENOUS
  Administered 2013-06-08: 100 ug via INTRAVENOUS
  Administered 2013-06-08: 50 ug via INTRAVENOUS
  Administered 2013-06-08: 100 ug via INTRAVENOUS
  Administered 2013-06-08: 50 ug via INTRAVENOUS

## 2013-06-08 MED ORDER — KETOROLAC TROMETHAMINE 30 MG/ML IJ SOLN: 30.0000 mg | Freq: Four times a day (QID) | INTRAMUSCULAR | Status: DC

## 2013-06-08 MED ORDER — HYDROCODONE-ACETAMINOPHEN 5-325 MG PO TABS: 1.0000 | ORAL_TABLET | ORAL | Status: DC | PRN

## 2013-06-08 MED ORDER — NEOSTIGMINE METHYLSULFATE 1 MG/ML IJ SOLN
INTRAMUSCULAR | Status: DC | PRN
  Administered 2013-06-08: 2.5 mg via INTRAVENOUS

## 2013-06-08 MED ORDER — PROPOFOL 10 MG/ML IV BOLUS
INTRAVENOUS | Status: DC | PRN
  Administered 2013-06-08: 150 mg via INTRAVENOUS
  Administered 2013-06-08: 50 mg via INTRAVENOUS

## 2013-06-08 MED ORDER — NALOXONE HCL 0.4 MG/ML IJ SOLN: 0.4000 mg | INTRAMUSCULAR | Status: DC | PRN

## 2013-06-08 MED ORDER — ACETAMINOPHEN 160 MG/5ML PO SOLN: 975.0000 mg | Freq: Once | ORAL | Status: AC

## 2013-06-08 SURGICAL SUPPLY — 94 items
ADH SKN CLS APL DERMABOND .7 (GAUZE/BANDAGES/DRESSINGS) ×4
BARRIER ADHS 3X4 INTERCEED (GAUZE/BANDAGES/DRESSINGS) IMPLANT
BRR ADH 4X3 ABS CNTRL BYND (GAUZE/BANDAGES/DRESSINGS)
CABLE HIGH FREQUENCY MONO STRZ (ELECTRODE) IMPLANT
CANISTER SUCTION 2500CC (MISCELLANEOUS) ×8 IMPLANT
CATH FOLEY 3WAY  5CC 16FR (CATHETERS) ×1
CATH FOLEY 3WAY 5CC 16FR (CATHETERS) ×1 IMPLANT
CATH ROBINSON RED A/P 16FR (CATHETERS) ×3 IMPLANT
CHLORAPREP W/TINT 26ML (MISCELLANEOUS) ×8 IMPLANT
CLOTH BEACON ORANGE TIMEOUT ST (SAFETY) ×5 IMPLANT
CONT PATH 16OZ SNAP LID 3702 (MISCELLANEOUS) ×5 IMPLANT
COVER MAYO STAND STRL (DRAPES) ×5 IMPLANT
COVER TABLE BACK 60X90 (DRAPES) ×5 IMPLANT
DECANTER SPIKE VIAL GLASS SM (MISCELLANEOUS) ×2 IMPLANT
DERMABOND ADVANCED (GAUZE/BANDAGES/DRESSINGS) ×1
DERMABOND ADVANCED .7 DNX12 (GAUZE/BANDAGES/DRESSINGS) ×4 IMPLANT
DEVICE TROCAR PUNCTURE CLOSURE (ENDOMECHANICALS) ×2 IMPLANT
DISSECTOR BLUNT TIP ENDO 5MM (MISCELLANEOUS) IMPLANT
DISSECTOR SPONGE CHERRY (GAUZE/BANDAGES/DRESSINGS) IMPLANT
DRAPE PROXIMA HALF (DRAPES) ×5 IMPLANT
DRSG VASELINE 3X18 (GAUZE/BANDAGES/DRESSINGS) ×4 IMPLANT
ELECT REM PT RETURN 9FT ADLT (ELECTROSURGICAL) ×5
ELECTRODE REM PT RTRN 9FT ADLT (ELECTROSURGICAL) ×1 IMPLANT
EVACUATOR SMOKE 8.L (FILTER) ×10 IMPLANT
FILTER SMOKE EVAC LAPAROSHD (FILTER) ×2 IMPLANT
FORCEPS CUTTING 33CM 5MM (CUTTING FORCEPS) IMPLANT
GAUZE PACKING 2X5 YD STERILE (GAUZE/BANDAGES/DRESSINGS) IMPLANT
GAUZE SPONGE 4X4 16PLY XRAY LF (GAUZE/BANDAGES/DRESSINGS) ×5 IMPLANT
GAUZE VASELINE 3X9 (GAUZE/BANDAGES/DRESSINGS) ×4 IMPLANT
GLOVE BIO SURGEON STRL SZ 6.5 (GLOVE) ×18 IMPLANT
GLOVE BIOGEL PI IND STRL 6.5 (GLOVE) ×14 IMPLANT
GLOVE BIOGEL PI IND STRL 7.0 (GLOVE) ×13 IMPLANT
GLOVE BIOGEL PI IND STRL 7.5 (GLOVE) ×4 IMPLANT
GLOVE BIOGEL PI INDICATOR 6.5 (GLOVE) ×6
GLOVE BIOGEL PI INDICATOR 7.0 (GLOVE) ×10
GLOVE BIOGEL PI INDICATOR 7.5 (GLOVE) ×1
GOWN PREVENTION PLUS LG XLONG (DISPOSABLE) ×21 IMPLANT
GOWN STRL REIN XL XLG (GOWN DISPOSABLE) ×24 IMPLANT
HEMOSTAT SURGICEL 2X14 (HEMOSTASIS) IMPLANT
NDL HYPO 25X1 1.5 SAFETY (NEEDLE) IMPLANT
NEEDLE HYPO 25X1 1.5 SAFETY (NEEDLE) IMPLANT
NEEDLE MAYO .5 CIRCLE (NEEDLE) ×5 IMPLANT
NS IRRIG 1000ML POUR BTL (IV SOLUTION) ×5 IMPLANT
OCCLUDER COLPOPNEUMO (BALLOONS) ×5 IMPLANT
PACK LAPAROSCOPY BASIN (CUSTOM PROCEDURE TRAY) ×5 IMPLANT
PACK LAVH (CUSTOM PROCEDURE TRAY) ×5 IMPLANT
PAD OB MATERNITY 4.3X12.25 (PERSONAL CARE ITEMS) ×5 IMPLANT
PROTECTOR NERVE ULNAR (MISCELLANEOUS) ×7 IMPLANT
SCALPEL HARMONIC ACE (MISCELLANEOUS) ×2 IMPLANT
SCISSORS LAP 5X35 DISP (ENDOMECHANICALS) IMPLANT
SET CYSTO W/LG BORE CLAMP LF (SET/KITS/TRAYS/PACK) ×5 IMPLANT
SET IRRIG TUBING LAPAROSCOPIC (IRRIGATION / IRRIGATOR) ×5 IMPLANT
SOLUTION ELECTROLUBE (MISCELLANEOUS) ×5 IMPLANT
SPONGE LAP 18X18 X RAY DECT (DISPOSABLE) ×10 IMPLANT
SPONGE SURGIFOAM ABS GEL 12-7 (HEMOSTASIS) IMPLANT
STAPLER VISISTAT 35W (STAPLE) IMPLANT
STRIP CLOSURE SKIN 1/2X4 (GAUZE/BANDAGES/DRESSINGS) ×5 IMPLANT
STRIP CLOSURE SKIN 1/4X3 (GAUZE/BANDAGES/DRESSINGS) IMPLANT
STRIP CLOSURE SKIN 1/4X4 (GAUZE/BANDAGES/DRESSINGS) IMPLANT
SUT CHROMIC 0 CT 1 (SUTURE) ×5 IMPLANT
SUT CHROMIC 0 UR 5 27 (SUTURE) IMPLANT
SUT MNCRL AB 3-0 PS2 27 (SUTURE) ×10 IMPLANT
SUT MON AB 3-0 SH 27 (SUTURE) ×5
SUT MON AB 3-0 SH27 (SUTURE) ×4 IMPLANT
SUT PDS AB 0 CT1 27 (SUTURE) IMPLANT
SUT PDS AB 1 CT1 36 (SUTURE) IMPLANT
SUT PLAIN 2 0 XLH (SUTURE) ×5 IMPLANT
SUT VIC AB 0 CT1 18XCR BRD8 (SUTURE) ×10 IMPLANT
SUT VIC AB 0 CT1 27 (SUTURE) ×10
SUT VIC AB 0 CT1 27XBRD ANBCTR (SUTURE) ×14 IMPLANT
SUT VIC AB 0 CT1 36 (SUTURE) ×5 IMPLANT
SUT VIC AB 0 CT1 8-18 (SUTURE) ×5
SUT VIC AB 2-0 SH 27 (SUTURE) ×5
SUT VIC AB 2-0 SH 27XBRD (SUTURE) ×4 IMPLANT
SUT VICRYL 0 ENDOLOOP (SUTURE) IMPLANT
SUT VICRYL 0 TIES 12 18 (SUTURE) ×2 IMPLANT
SUT VICRYL 0 UR6 27IN ABS (SUTURE) ×16 IMPLANT
SYR 50ML LL SCALE MARK (SYRINGE) ×5 IMPLANT
SYR BULB IRRIGATION 50ML (SYRINGE) ×5 IMPLANT
SYR CONTROL 10ML LL (SYRINGE) IMPLANT
SYR TB 1ML LUER SLIP (SYRINGE) ×5 IMPLANT
TIP UTERINE 5.1X6CM LAV DISP (MISCELLANEOUS) IMPLANT
TIP UTERINE 6.7X10CM GRN DISP (MISCELLANEOUS) ×2 IMPLANT
TIP UTERINE 6.7X6CM WHT DISP (MISCELLANEOUS) IMPLANT
TIP UTERINE 6.7X8CM BLUE DISP (MISCELLANEOUS) IMPLANT
TOWEL OR 17X24 6PK STRL BLUE (TOWEL DISPOSABLE) ×10 IMPLANT
TRAY FOLEY CATH 14FR (SET/KITS/TRAYS/PACK) ×5 IMPLANT
TROCAR BALLN 12MMX100 BLUNT (TROCAR) ×7 IMPLANT
TROCAR XCEL NON-BLD 11X100MML (ENDOMECHANICALS) ×8 IMPLANT
TROCAR XCEL NON-BLD 5MMX100MML (ENDOMECHANICALS) ×5 IMPLANT
TROCAR XCEL OPT SLVE 5M 100M (ENDOMECHANICALS) ×7 IMPLANT
TUBING FILTER THERMOFLATOR (ELECTROSURGICAL) ×5 IMPLANT
WARMER LAPAROSCOPE (MISCELLANEOUS) ×5 IMPLANT
WATER STERILE IRR 1000ML POUR (IV SOLUTION) ×5 IMPLANT

## 2013-06-08 NOTE — Anesthesia Preprocedure Evaluation (Signed)
Anesthesia Evaluation  Patient identified by MRN, date of birth, ID band Patient awake    Reviewed: Allergy & Precautions, H&P , NPO status , Patient's Chart, lab work & pertinent test results, reviewed documented beta blocker date and time   History of Anesthesia Complications Negative for: history of anesthetic complications  Airway Mallampati: II TM Distance: >3 FB Neck ROM: full    Dental  (+) Teeth Intact   Pulmonary neg pulmonary ROS,  breath sounds clear to auscultation  Pulmonary exam normal       Cardiovascular Exercise Tolerance: Good negative cardio ROS  Rhythm:regular Rate:Normal     Neuro/Psych negative neurological ROS  negative psych ROS   GI/Hepatic negative GI ROS, Neg liver ROS,   Endo/Other  negative endocrine ROS  Renal/GU negative Renal ROS  Female GU complaint     Musculoskeletal   Abdominal   Peds  Hematology negative hematology ROS (+)   Anesthesia Other Findings   Reproductive/Obstetrics negative OB ROS                           Anesthesia Physical Anesthesia Plan  ASA: I  Anesthesia Plan: General ETT   Post-op Pain Management:    Induction:   Airway Management Planned:   Additional Equipment:   Intra-op Plan:   Post-operative Plan:   Informed Consent: I have reviewed the patients History and Physical, chart, labs and discussed the procedure including the risks, benefits and alternatives for the proposed anesthesia with the patient or authorized representative who has indicated his/her understanding and acceptance.   Dental Advisory Given  Plan Discussed with: CRNA and Surgeon  Anesthesia Plan Comments:         Anesthesia Quick Evaluation  

## 2013-06-08 NOTE — Transfer of Care (Signed)
Immediate Anesthesia Transfer of Care Note  Patient: Julia Hogan  Procedure(s) Performed: Procedure(s) with comments: HYSTERECTOMY TOTAL LAPAROSCOPIC (N/A) - hand morcellated at vagina BILATERAL SALPINGECTOMY CYSTOSCOPY  Patient Location: PACU  Anesthesia Type:General  Level of Consciousness: sedated  Airway & Oxygen Therapy: Patient Spontanous Breathing and Patient connected to nasal cannula oxygen  Post-op Assessment: Report given to PACU RN and Post -op Vital signs reviewed and stable  Post vital signs: stable  Complications: No apparent anesthesia complications

## 2013-06-08 NOTE — Interval H&P Note (Signed)
History and Physical Interval Note:  06/08/2013 10:13 AM  Julia Hogan  has presented today for surgery, with the diagnosis of Dysfuntional Uterine Bleeding, Fibroids  The various methods of treatment have been discussed with the patient and family. After consideration of risks, benefits and other options for treatment, the patient has consented to  Procedure(s): HYSTERECTOMY TOTAL LAPAROSCOPIC (N/A) LAPAROSCOPIC ASSISTED VAGINAL HYSTERECTOMY (N/A) HYSTERECTOMY ABDOMINAL (N/A) as a surgical intervention .  The patient's history has been reviewed, patient examined, no change in status, stable for surgery.  I have reviewed the patient's chart and labs.  Questions were answered to the patient's satisfaction.   EMBX was benign 10/13  [facility]

## 2013-06-08 NOTE — Op Note (Signed)
Preop Diagnosis: Dysfuntional Uterine Bleeding, Fibroids   Postop Diagnosis: Dysfuntional Uterine Bleeding, Fibroids   Procedure: HYSTERECTOMY TOTAL LAPAROSCOPIC LAPAROSCOPIC ASSISTED VAGINAL HYSTERECTOMY HYSTERECTOMY ABDOMINAL   Anesthesia: General   Anesthesiologist: Responsible provider cannot be found from this context.   Attending: Michael Litter, MD   Assistant: Osborn Coho MD  Findings: 14-16 week size irregular uterus.  Normal appearing adnexa and abdominal anatlomy  Pathology: uterus and cervix  Fluids:  UOP:  EBL:461ml  Complications:  Procedure: The patient was taken to the operating room, placed under general anesthesia and prepped and draped in the normal sterile fashion. A Foley catheter was placed in the bladder. The uterus sounded to 12cm. A weighted speculum and vaginal retractors were placed in the vagina. Tenaculum was placed on the anterior lip of the cervix.  A size 10cm tip was used and the rumi was placed, tip balloon and occluder insufflated. Attention was then turned to the abdomen. A 10 mm infraumbilical incision was made with the scalpel after 5 cc of 25% percent Marcaine was used for local anesthesia. The subcutaneous tissue was dissected and the fascia was incised with the knife. A purse string stitch was placed in the fascia and Hassan placed into the intra-abdominal cavity and anchored to the suture. Intraabdominal placement was confirmed with the laparoscope.  Two 5 mm trochars were placed in the right and left lower quadrants under direct visualization with the laparoscope.  A 10 mm incision was placed 2 cm above the symphysis pubis in the midline.  11mm trocar was placed under direct visualization.   The harmonic scalpel was used to cauterize and cut the uterine ovarian ligaments bilaterally.   Both round ligaments were cauterized and cut with the harmonic scalpel as well and the bladder flap created with the harmonic scalpel and removed  away from the uterus. Both uterine arteries were cauterized and cut with harmonic scalpel.  The harmonic was used to circumscribe the [facility] ring and free the uterus and cervix. The uterus was then pulled into the vagina.  Because of the size of the uterus, it had to be morcelated manually using scissors and disecting out fibroids.   There was bleeding seen from a vessel on the patients left side just inside the cuff.  This was made hemostatic with suture.  Irrigation was done and the cuff and inside the cuff was hemostatic.  The vaginal cuff was reaproximated using interupeted suture of 0 vicryl.  I then went to the top of the patient.  The abdomen was reinsuflated.  There was some bleeding along the vaginal cuff.  This was made hemostatic by placing one PDS suture.  There was some oozing from the right side of the cuff and the right ovarian pedicle.  This was made hemostatic with bipolar cautery using the kleppingers.   Gas was allowed to leave the abdomen to check for bleeders and all pedicles were seen to be hemostatic the patient was given indigo carmine.he 10 mm port on the patients right side was closed with 0 Vicryl using the fascial closure device.  All trochars were removed under direct visualization using the laparoscope.  The umbilical fascia was reapproximated by tying the circumferential suture. The two 10 mm incisions were closed with 3-0 Monocryl via a subcuticular stitch.  All remaining skin incisions were closed with Dermabond and the 10 mm skin incisions were reinforced using Dermabond.  Cystoscopy was performed and both ureters were seen to efflux indigo carmine without difficulty. The  bladder had full integrity with no suture or laceration visualized.  The vagina was inspected and the cuff was noted to be intact.     Sponge lap and needle counts were correct.  The patient tolerated the procedure well and was returned to the PACU in stable condition

## 2013-06-08 NOTE — Anesthesia Procedure Notes (Signed)
Performed by: Kieron Kantner L       

## 2013-06-08 NOTE — Anesthesia Postprocedure Evaluation (Deleted)
  Anesthesia Post-op Note  Anesthesia Post Note  Patient: Julia Hogan  Procedure(s) Performed: Procedure(s) (LRB): HYSTERECTOMY TOTAL LAPAROSCOPIC (N/A) BILATERAL SALPINGECTOMY CYSTOSCOPY  Anesthesia type: General  Patient location: PACU  Post pain: Pain level controlled  Post assessment: Post-op Vital signs reviewed  Last Vitals:  Filed Vitals:   06/08/13 1430  BP: 108/65  Pulse: 60  Temp:   Resp: 16    Post vital signs: Reviewed  Level of consciousness: sedated  Complications: No apparent anesthesia complications

## 2013-06-08 NOTE — Anesthesia Postprocedure Evaluation (Signed)
  Anesthesia Post-op Note  Patient: Brand Males  Procedure(s) Performed: Procedure(s) with comments: HYSTERECTOMY TOTAL LAPAROSCOPIC (N/A) - hand morcellated at vagina BILATERAL SALPINGECTOMY CYSTOSCOPY  Patient Location: Women[facility] Unit  Anesthesia Type:General  Level of Consciousness: awake, alert  and oriented  Airway and Oxygen Therapy: Patient Spontanous Breathing and Patient connected to nasal cannula oxygen  Post-op Pain: mild  Post-op Assessment: Post-op Vital signs reviewed and Patient's Cardiovascular Status Stable  Post-op Vital Signs: Reviewed and stable  Complications: No apparent anesthesia complications

## 2013-06-08 NOTE — Progress Notes (Signed)
Day of Surgery Procedure(s) (LRB): HYSTERECTOMY TOTAL LAPAROSCOPIC (N/A) BILATERAL SALPINGECTOMY CYSTOSCOPY  Subjective: Patient reports tolerating PO.  Just liquids.   Objective: I have reviewed patient's vital signs and intake and output.  General: alert and cooperative Resp: clear to auscultation bilaterally Cardio: regular rate and rhythm GI: soft, non-tender; bowel sounds normal; no masses,  no organomegaly and incision: clean, dry and intact Extremities: extremities normal, atraumatic, no cyanosis or edema Vaginal Bleeding: minimal  Assessment: s/p Procedure(s) with comments: HYSTERECTOMY TOTAL LAPAROSCOPIC (N/A) - hand morcellated at vagina BILATERAL SALPINGECTOMY CYSTOSCOPY: stable  Plan: Advance diet Encourage ambulation  LOS: 0 days    Kensleigh Gates A 06/08/2013, 6:53 PM

## 2013-06-08 NOTE — H&P (View-Only) (Signed)
Julia Hogan is a 42 y.o. female G3; P 2-0-1-2 who presents for hysterectomy because of symptomatic uterine fibroids, menorrhagia and a history of severe anemia. For years the patient has been able to manage her chronic menorrhagia with oral contraceptives until this past year her flow became unbearable. During the course of her 8 day menses, she would change a pad every 30 minutes and experience cramping that she rated as a 7/10 on a 10 point pain scale. Fortunately her cramps were relieved with Ibuprofen 600 mg alternating with Tylenol Extra Strength.  She admits to intermenstrual bleeding characterized by spotting with episodes of clots, cramps and pelvic pressure.  She goes on to report lower back spasms and constipation but has managed both with muscle relaxants and  Miralax respectively.  She denies any urinary tract symptoms, dyspareunia, urgency, frequency or dyspareunia.  In March of this year, the patient bled so heavily that her Hgb was 5.1 and was subsequently  placed on IV Premarin and transfused 3 units of packed red blood cells. There was an attempt to perform Thermachoice endometrial ablation however, it had to be aborted due to the endometrial cavity being too large.  A TSH at that time was normal.  A pelvic ultrasound from October 2013 showed a retroverted uterus: 10.23 x 8.54 x 6.51 cm  [SHG showed no endometrial masses], #4 measurable fibroids: fundal-5.72 x 3.86 x 4.42 cm, posterior-3.72 x 3.72 x 2.95 cm, right serosal-2.94 x 2.45 x 3.09 cm and intramural-2.59 x 1.86 x 2.38cm.  Right ovary-3.19 x 1.29 x 1.29 cm and left ovary- 3.86 x 1.98 x 1.41 cm..  Endometrial biopsy done at that visit was benign.  Since her major bleeding episode in March, the patient has been on Ovcon 35 continuously with continued but less bleeding.  A review of both medical and surgical management options were given to the patient along with the risks and benefits of each however, the patient has decided on definitive  therapy in the form of hysterectomy.   Past Medical History  OB History: G3; P 2-0-1-2 SVD:  1998 and 2003 (largest infant 8+ pounds)  GYN History: menarche: 42 YO;    LMP: see HPI    Contracepton oral contraceptives (estrogen/progesterone)  The patient denies history of sexually transmitted disease.  Denies history of abnormal PAP smear;  Last PAP smear:  2013  Medical History: Severe anemia  Surgical History:  2014 Attempted Thermachoice Endometrial Ablation-unable to complete due to size of uterine cavity Denies problems with anesthesia. Was transfused 3 units of packed red blood cells in April 2014  Family History: Hypertension, thyroid disease, esophageal cancer,migraines and cardiovascular disease  Social History:  Married, employed as an Government social research officer; Denies alcohol, tobacco or illicit drug use  Medicine:  Ovcon 35 daily Iron Supplementation daily  Allergies  Allergen Reactions  . Penicillins Hives    Denies sensitivity to peanuts, shellfish, soy, latex or adhesives.   ROS: Admits to glasses to drive but, enies headache, vision changes, nasal congestion, dysphagia, tinnitus, dizziness, hoarseness, cough,  chest pain, shortness of breath, nausea, vomiting, diarrhea,constipation,  urinary frequency, urgency  dysuria, hematuria, vaginitis symptoms, pelvic pain, swelling of joints,easy bruising,  myalgias, arthralgias, skin rashes, unexplained weight loss and except as is mentioned in the history of present illness, patient's review of systems is otherwise negative.   Physical Exam  Bp 120/80  P 72  R 14  T 98.4 degrees F orally  Weight  142lbs  Height  5'4"  BMI  24.4  Neck: supple without masses or thyromegaly Lungs: clear to auscultation Heart: regular rate and rhythm Abdomen: soft, non-tender and no organomegaly; firm mass from pelvis to approximately 3 fingers above symphysis pubis Pelvic:EGBUS- wnl; vagina-normal rugae with moderate blood; uterus-14-16 week  sized and irregular,  adnexae-no tenderness or masses Extremities:  no clubbing, cyanosis or edema   Assesment:  Symptomatic Uterine Fibroids                       Menorrhagia                       History of Severe Anemia   Disposition:  A discussion was held with patient regarding the indication for her procedure(s) along with the risks, which include but are not limited to: reaction to anesthesia, damage to adjacent organs, infection and excessive bleeding. The patient was given a Miralax Bowel Prep to be completed the day before her surgery.  She verbalized understanding of these risks and pre-operative instructions and has consented to proceed with a Total Laparoscopic Hysterectomy with a possible Laparoscopically Assisted Vaginal Hysterectomy and Cystoscopy at [facility] on June 08, 2013 at 9 a.m.   CSN# [id number]   [name] J. Lowell Guitar, PA-C  for Dr. Pierre Bali.Dillard

## 2013-06-09 ENCOUNTER — Encounter (HOSPITAL_COMMUNITY): Payer: Self-pay | Admitting: Obstetrics and Gynecology

## 2013-06-09 LAB — BASIC METABOLIC PANEL
BUN: 4 mg/dL — ABNORMAL LOW (ref 6–23)
Chloride: 105 mEq/L (ref 96–112)
Creatinine, Ser: 0.62 mg/dL (ref 0.50–1.10)
GFR calc Af Amer: >90 mL/min (ref >90)
GFR calc non Af Amer: >90 mL/min (ref >90)
Glucose, Bld: 111 mg/dL — ABNORMAL HIGH (ref 70–99)

## 2013-06-09 LAB — CBC
HCT: 28.3 % — ABNORMAL LOW (ref 36.0–46.0)
Hemoglobin: 9.5 g/dL — ABNORMAL LOW (ref 12.0–15.0)
MCHC: 33.6 g/dL (ref 30.0–36.0)
MCV: 83 fL (ref 78.0–100.0)
RDW: 13.5 % (ref 11.5–15.5)

## 2013-06-09 MED ORDER — PROMETHAZINE HCL 12.5 MG PO TABS: 12.5000 mg | ORAL_TABLET | Freq: Four times a day (QID) | ORAL | Status: AC | PRN

## 2013-06-09 MED ORDER — ONDANSETRON HCL 4 MG PO TABS: 4.0000 mg | ORAL_TABLET | Freq: Three times a day (TID) | ORAL | Status: AC | PRN

## 2013-06-09 MED ORDER — IBUPROFEN 600 MG PO TABS: ORAL_TABLET | ORAL | Status: AC

## 2013-06-09 MED ORDER — IBUPROFEN 600 MG PO TABS: 600.0000 mg | ORAL_TABLET | Freq: Four times a day (QID) | ORAL | Status: AC | PRN

## 2013-06-09 MED ORDER — HYDROCODONE-ACETAMINOPHEN 5-325 MG PO TABS: 1.0000 | ORAL_TABLET | ORAL | Status: AC | PRN

## 2013-06-09 MED ORDER — IBUPROFEN 600 MG PO TABS: 600.0000 mg | ORAL_TABLET | Freq: Four times a day (QID) | ORAL | Status: DC | PRN

## 2013-06-09 NOTE — Progress Notes (Signed)
Pt teaching complete out in wheelchair  Family with pt

## 2013-06-09 NOTE — Anesthesia Postprocedure Evaluation (Signed)
  Anesthesia Post-op Note  Patient: Julia Hogan  Procedure(s) Performed: Procedure(s) with comments: HYSTERECTOMY TOTAL LAPAROSCOPIC (N/A) - hand morcellated at vagina BILATERAL SALPINGECTOMY CYSTOSCOPY Patient is awake and responsive. Pain and nausea are reasonably well controlled. Vital signs are stable and clinically acceptable. Oxygen saturation is clinically acceptable. There are no apparent anesthetic complications at this time. Patient is ready for discharge.

## 2013-06-09 NOTE — Progress Notes (Signed)
KAISLEE CHAO is a42 y.o.  [id number]  Post Op Date # 1; TLH/BS/CYSTOSCOPY  Subjective: Patient is Doing well postoperatively. Patient has Pain is controlled with current analgesics. Medications being used: prescription NSAID's including Toradol and narcotic analgesics including Dilaudid., Patient ambulated in the halls without difficulty, tolerating liquids without nausea and has no complaints of dizziness.  Objective: Vital signs in last 24 hours: Temp:  [97.5 F (36.4 C)-99.5 F (37.5 C)] 98.3 F (36.8 C) (07/01 0618) Pulse Rate:  [58-88] 71 (07/01 0618) Resp:  [15-23] 16 (07/01 0618) BP: (102-141)/(56-89) 111/65 mmHg (07/01 0618) SpO2:  [99 %-100 %] 100 % (07/01 0618) Weight:  [140 lb (63.504 kg)] 140 lb (63.504 kg) (06/30 1700)  Intake/Output from previous day: 06/30 0701 - 07/01 0700 In: 4942.3 [P.O.:100; I.V.:4842.3] Out: 2355 [Urine:1955] Intake/Output this shift:    Recent Labs Lab 06/03/13 0850 06/09/13 0541  WBC 9.4 9.5  HGB 13.0 9.5*  HCT 38.2 28.3*  PLT 204 201     Recent Labs Lab 06/09/13 0541  NA 136  K 3.8  CL 105  CO2 26  BUN 4*  CREATININE 0.62  CALCIUM 8.1*  GLUCOSE 111*    EXAM: General: alert, cooperative and no distress Resp: clear to auscultation bilaterally Cardio: regular rate and rhythm, S1, S2 normal, no murmur, click, rub or gallop GI: Soft, bowel sounds present, incisions dry and intact with umbilical incision dressing with dry stain. Extremities: SCD hose in place and functioning; no calf tenderness. Vaginal Bleeding: none   Assessment: s/p Procedure(s): HYSTERECTOMY TOTAL LAPAROSCOPIC BILATERAL SALPINGECTOMY CYSTOSCOPY: stable  Plan: Advance diet Encourage ambulation Advance to PO medication  LOS: 1 day    Aryia Delira, PA-C 06/09/2013 7:32 AM

## 2013-06-09 NOTE — Discharge Summary (Signed)
Physician Discharge Summary  Patient ID: Julia Hogan MRN: [id number] DOB/AGE: July 30, 1971 42 y.o.  Admit date: 06/08/2013 Discharge date: 06/09/2013   Discharge Diagnoses:  Symptomatic Uterine Fibroids, Menorrhagia and Anemia Active Problems:   * No active hospital problems. *   Operation: Total Laparoscopic Hysterectomy, Bilateral Salpingectomy and Cystoscopy   Discharged Condition: Good  Hospital Course: On the date of admission the patient underwent the aforementioned procedures, tolerating them well.  Post operative course was unremarkable with the patient resuming bowel and bladder function by post operative day #1 and was therefore deemed ready for discharge home.  Disposition: 01-Home or Self Care  Discharge Medications:    Medication List    STOP taking these medications       OVCON-35 (28) 0.4-35 MG-MCG tablet  Generic drug:  norethindrone-ethinyl estradiol      TAKE these medications       ferrous sulfate 325 (65 FE) MG tablet  Take 325 mg by mouth daily with breakfast.     HYDROcodone-acetaminophen 5-325 MG per tablet  Commonly known as:  NORCO/VICODIN  Take 1-2 tablets by mouth every 4 (four) hours as needed.     ibuprofen 600 MG tablet  Commonly known as:  ADVIL,MOTRIN  1 po pc every 6 hours for 5 days then prn-pain     ondansetron 4 MG tablet  Commonly known as:  ZOFRAN  Take 1 tablet (4 mg total) by mouth every 8 (eight) hours as needed for nausea.        Follow-up: Dr. Samule Ohm A. Maurina Fawaz, July 16, 2013 at 10:15 a.m.   SignedHenreitta Hogan 06/09/2013, 8:24 AM

## 2013-10-15 ENCOUNTER — Other Ambulatory Visit: Payer: Self-pay

## 2014-08-12 ENCOUNTER — Other Ambulatory Visit: Payer: Self-pay

## 2014-08-12 DIAGNOSIS — Z1231 Encounter for screening mammogram for malignant neoplasm of breast: Secondary | ICD-10-CM

## 2014-08-27 ENCOUNTER — Ambulatory Visit
Admission: RE | Admit: 2014-08-27 | Discharge: 2014-08-27 | Disposition: A | Discharge status: Home / Self Care | Payer: BC Managed Care – PPO | Source: Ambulatory Visit

## 2014-08-27 ENCOUNTER — Other Ambulatory Visit: Payer: Self-pay | Admitting: Obstetrics and Gynecology

## 2014-08-27 DIAGNOSIS — R928 Other abnormal and inconclusive findings on diagnostic imaging of breast: Secondary | ICD-10-CM

## 2014-08-27 DIAGNOSIS — Z1231 Encounter for screening mammogram for malignant neoplasm of breast: Secondary | ICD-10-CM

## 2014-09-02 ENCOUNTER — Ambulatory Visit
Admission: RE | Admit: 2014-09-02 | Discharge: 2014-09-02 | Disposition: A | Discharge status: Home / Self Care | Payer: BC Managed Care – PPO | Source: Ambulatory Visit | Attending: Obstetrics and Gynecology | Admitting: Obstetrics and Gynecology

## 2014-09-02 DIAGNOSIS — R928 Other abnormal and inconclusive findings on diagnostic imaging of breast: Secondary | ICD-10-CM

## 2014-10-11 ENCOUNTER — Encounter (HOSPITAL_COMMUNITY): Payer: Self-pay | Admitting: Obstetrics and Gynecology

## 2015-03-31 ENCOUNTER — Other Ambulatory Visit: Payer: Self-pay | Admitting: Obstetrics and Gynecology

## 2015-03-31 DIAGNOSIS — N644 Mastodynia: Secondary | ICD-10-CM

## 2015-03-31 DIAGNOSIS — N63 Unspecified lump in unspecified breast: Secondary | ICD-10-CM

## 2015-04-11 ENCOUNTER — Ambulatory Visit
Admission: RE | Admit: 2015-04-11 | Discharge: 2015-04-11 | Disposition: A | Discharge status: Home / Self Care | Payer: 59 | Source: Ambulatory Visit | Attending: Obstetrics and Gynecology | Admitting: Obstetrics and Gynecology

## 2015-04-11 DIAGNOSIS — N63 Unspecified lump in unspecified breast: Secondary | ICD-10-CM

## 2015-04-11 DIAGNOSIS — N644 Mastodynia: Secondary | ICD-10-CM

## 2015-08-29 ENCOUNTER — Other Ambulatory Visit: Payer: Self-pay

## 2015-08-29 DIAGNOSIS — Z1231 Encounter for screening mammogram for malignant neoplasm of breast: Secondary | ICD-10-CM

## 2015-09-13 ENCOUNTER — Ambulatory Visit
Admission: RE | Admit: 2015-09-13 | Discharge: 2015-09-13 | Disposition: A | Discharge status: Home / Self Care | Payer: 59 | Source: Ambulatory Visit

## 2015-09-13 DIAGNOSIS — Z1231 Encounter for screening mammogram for malignant neoplasm of breast: Secondary | ICD-10-CM

## 2017-08-27 ENCOUNTER — Encounter (HOSPITAL_COMMUNITY): Payer: Self-pay | Admitting: Obstetrics and Gynecology
